# Patient Record
Sex: Male | Born: 1995 | Hispanic: No
Health system: Southern US, Community
[De-identification: ages and names within clinical notes are randomized; demographics above are authoritative.]

---

## 2019-06-16 ENCOUNTER — Emergency Department (HOSPITAL_COMMUNITY)
Admission: EM | Admit: 2019-06-16 | Discharge: 2019-06-17 | Disposition: A | Discharge status: Home / Self Care | Payer: BC Managed Care – PPO | Attending: Emergency Medicine | Admitting: Emergency Medicine

## 2019-06-16 ENCOUNTER — Emergency Department (HOSPITAL_COMMUNITY): Payer: BC Managed Care – PPO

## 2019-06-16 ENCOUNTER — Encounter (HOSPITAL_COMMUNITY): Payer: Self-pay

## 2019-06-16 ENCOUNTER — Other Ambulatory Visit: Payer: Self-pay

## 2019-06-16 DIAGNOSIS — Y929 Unspecified place or not applicable: Secondary | ICD-10-CM | POA: Diagnosis not present

## 2019-06-16 DIAGNOSIS — S8991XA Unspecified injury of right lower leg, initial encounter: Secondary | ICD-10-CM | POA: Insufficient documentation

## 2019-06-16 DIAGNOSIS — W010XXA Fall on same level from slipping, tripping and stumbling without subsequent striking against object, initial encounter: Secondary | ICD-10-CM | POA: Insufficient documentation

## 2019-06-16 DIAGNOSIS — Y9367 Activity, basketball: Secondary | ICD-10-CM | POA: Insufficient documentation

## 2019-06-16 DIAGNOSIS — Y999 Unspecified external cause status: Secondary | ICD-10-CM | POA: Insufficient documentation

## 2019-06-16 DIAGNOSIS — W19XXXA Unspecified fall, initial encounter: Secondary | ICD-10-CM

## 2019-06-16 MED ORDER — IBUPROFEN 800 MG PO TABS
800.0000 mg | ORAL_TABLET | Freq: Once | ORAL | Status: AC
  Administered 2019-06-16: 800 mg via ORAL
  Filled 2019-06-16: qty 1

## 2019-06-16 MED ORDER — ACETAMINOPHEN 500 MG PO TABS
1000.0000 mg | ORAL_TABLET | Freq: Once | ORAL | Status: AC
  Administered 2019-06-16: 1000 mg via ORAL
  Filled 2019-06-16: qty 2

## 2019-06-16 NOTE — ED Notes (Signed)
Pt to xray

## 2019-06-16 NOTE — ED Triage Notes (Signed)
Pt states that while playing basketball tonight around 1930 he fell and hurt his right knee. Pt unable to walk using the right leg.

## 2019-06-16 NOTE — ED Provider Notes (Signed)
[facility] DEPT Provider Note   CSN: [id number] Arrival date & time: 06/16/19  2132    History   Chief Complaint Chief Complaint  Patient presents with  . Knee Pain    HPI Jimmy Hunt is a 23 y.o. male with a history of obesity who presents to the emergency department with a chief complaint of fall.  The patient reports that he was playing basketball when he tripped and fell.  He reports that he fell to the right, and his right knee splayed out to the left side of his body.  He denies hitting his head, syncope, nausea, or vomiting.  He reports that he was able to stand after the fall, ambulate back to his car, and drive his car back to [place] from [facility]  He reports the pain was mild after the fall and continued to worsen.  He reports that by the time he arrived in [place] that he was unable to bear weight without significant pain on his right leg.  He denies numbness, weakness, redness, swelling, fever, chills, right ankle or hip pain.  No previous right knee injury.  No treatment prior to arrival.       The history is provided by the patient. No language interpreter was used.    History reviewed. No pertinent past medical history.  There are no active problems to display for this patient.   History reviewed. No pertinent surgical history.      Home Medications    Prior to Admission medications   Medication Sig Start Date End Date Taking? Authorizing Provider  methocarbamol (ROBAXIN) 500 MG tablet Take 1 tablet (500 mg total) by mouth 2 (two) times daily. 06/17/19   Kazumi Lachney A, PA-C    Family History No family history on file.  Social History Social History   Tobacco Use  . Smoking status: Not on file  Substance Use Topics  . Alcohol use: Not on file  . Drug use: Not on file     Allergies   Patient has no known allergies.   Review of Systems Review of Systems  Constitutional: Negative for  activity change.  Respiratory: Negative for shortness of breath.   Cardiovascular: Negative for chest pain.  Gastrointestinal: Negative for abdominal pain, diarrhea and vomiting.  Musculoskeletal: Positive for arthralgias, gait problem, joint swelling and myalgias. Negative for back pain, neck pain and neck stiffness.  Skin: Negative for rash.  Neurological: Negative for dizziness, syncope, weakness and numbness.     Physical Exam Updated Vital Signs BP (!) 166/91   Pulse 93   Temp 99.2 F (37.3 C) (Oral)   Resp 18   Ht 5\' 10"  (1.778 m)   Wt (!) 149.7 kg   SpO2 95%   BMI 47.35 kg/m   Physical Exam Vitals signs and nursing note reviewed.  Constitutional:      Appearance: He is well-developed.  HENT:     Head: Normocephalic.  Eyes:     Conjunctiva/sclera: Conjunctivae normal.  Neck:     Musculoskeletal: Neck supple.  Cardiovascular:     Rate and Rhythm: Normal rate and regular rhythm.     Heart sounds: No murmur.  Pulmonary:     Effort: Pulmonary effort is normal.  Abdominal:     General: There is no distension.     Palpations: Abdomen is soft.  Musculoskeletal:        General: Tenderness present. No deformity or signs of injury.     Right  lower leg: No edema.     Left lower leg: No edema.     Comments: Tender to palpation diffusely to the medial and lateral joint line of the right knee.  No focal tenderness over the quadriceps or patellar tendon.  Full active and passive range of motion of the right knee as well as the right hip and ankle.  Independently moves all digits of the right foot.  DP pulses are 2+ and symmetric.  Sensation is intact and equal throughout.  5-5 strength against resistance with dorsiflexion plantarflexion.  No obvious deformities or masses to the right knee.  Negative anterior drawer test.  Negative posterior drawer test.  Increased pain with valgus stress test.  Normal varus stress test.  Skin:    General: Skin is warm and dry.  Neurological:      Mental Status: He is alert.  Psychiatric:        Behavior: Behavior normal.      ED Treatments / Results  Labs (all labs ordered are listed, but only abnormal results are displayed) Labs Reviewed - No data to display  EKG None  Radiology Dg Knee Complete 4 Views Right  Result Date: 06/16/2019 CLINICAL DATA:  Right knee injury during basketball. Unable to bear weight. EXAM: RIGHT KNEE - COMPLETE 4+ VIEW COMPARISON:  None. FINDINGS: No evidence of fracture, dislocation, or joint effusion. No evidence of arthropathy or other focal bone abnormality. Thickening and irregularity of the patellar tendon with stranding in Hoffa's fat pad. No frank discontinuity. IMPRESSION: Thickening and irregularity of the patellar tendon may suggest soft tissue injury. No acute fracture or traumatic malalignment. Electronically Signed   By: Kreg ShropshirePrice  DeHay M.D.   On: 06/16/2019 22:16    Procedures Procedures (including critical care time)  Medications Ordered in ED Medications  ibuprofen (ADVIL) tablet 800 mg (800 mg Oral Given 06/16/19 2338)  acetaminophen (TYLENOL) tablet 1,000 mg (1,000 mg Oral Given 06/16/19 2338)     Initial Impression / Assessment and Plan / ED Course  I have reviewed the triage vital signs and the nursing notes.  Pertinent labs & imaging results that were available during my care of the patient were reviewed by me and considered in my medical decision making (see chart for details).        23 year old male presenting with right knee pain after a fall while playing basketball.  He was able to stand, walk, and [place] [facility] [place] [facility] [place] prior to arrival.  He is hemodynamically stable in the ER.  He was given Tylenol and ibuprofen for pain control.  He is neurovascularly intact on exam.  X-ray of the right knee with thickening and irregularity of the patellar tendon that may suggest soft tissue injury.  On exam, patient has no focal tenderness over  the patellar tendon.  He has full active and passive range of motion of the knee.  Pain is increased with valgus stress test, but his anterior and posterior drawer test are negative as well as his varus stress test.  Low suspicion for patellar tendon rupture, right knee fracture or dislocation.  He was placed in a knee sleeve and was given crutches.  He has been given a referral to orthopedics for follow-up.  Recommended RICE therapy.  All questions answered.  Blood pressure was elevated towards the end of his visits, but he is having no symptoms associated with hypertensive urgency or emergency.  Recommended recheck of his blood pressure in 1 week.  He is otherwise hemodynamically stable and in no acute distress.  Safe for discharge home with outpatient follow-up.  Final Clinical Impressions(s) / ED Diagnoses   Final diagnoses:  Fall, initial encounter  Injury of right knee, initial encounter    ED Discharge Orders         Ordered    methocarbamol (ROBAXIN) 500 MG tablet  2 times daily     06/17/19 0018           Emmajean Ratledge, Pedro EarlsMia A, PA-C 06/17/19 0403    Palumbo, April, MD 06/17/19 [id number]

## 2019-06-17 MED ORDER — METHOCARBAMOL 500 MG PO TABS: 500.0000 mg | ORAL_TABLET | Freq: Two times a day (BID) | ORAL | 0 refills | Status: AC

## 2019-06-17 NOTE — Discharge Instructions (Signed)
Thank you for allowing me to care for you today in the Emergency Department.   Wear the brace on your right knee unless you are showering.  You can take 600 mg of ibuprofen with food or 650 mg of Tylenol once every 6 hours for pain control.  You can also alternate between these medications every 3 hours.  For instance, you can take Tylenol at noon, followed by a dose of ibuprofen at 3, followed by a dose of Tylenol at 6.  You can take 1 tablet of Robaxin up to 2 times daily.  This medication may make you drowsy.  Do not work or drive until you know how it impacts you.  Elevate your right leg so that your toes are at or above the level of your nose.  Apply an ice pack for 15 to 20 minutes as frequently as needed to help with pain and swelling.  Use the crutches until you can bear weight without significant pain on your right foot.  If you remain unable to bear weight or if your pain does not start to improve with this regimen in the next 4 to 5 days, please follow-up with orthopedics.  Their number is listed above.  Return to the emergency department if you have any fall or injury, if your toes turn blue, if you develop new weakness or numbness, have another fall, or develop other new, concerning symptoms.

## 2019-07-14 ENCOUNTER — Other Ambulatory Visit: Payer: Self-pay

## 2019-07-14 DIAGNOSIS — Z20822 Contact with and (suspected) exposure to covid-19: Secondary | ICD-10-CM

## 2019-07-15 LAB — NOVEL CORONAVIRUS, NAA: SARS-CoV-2, NAA: NOT DETECTED

## 2020-09-29 ENCOUNTER — Other Ambulatory Visit: Payer: Self-pay

## 2020-09-29 DIAGNOSIS — Z20822 Contact with and (suspected) exposure to covid-19: Secondary | ICD-10-CM

## 2020-09-30 LAB — NOVEL CORONAVIRUS, NAA: SARS-CoV-2, NAA: NOT DETECTED

## 2020-09-30 LAB — SARS-COV-2, NAA 2 DAY TAT

## 2021-06-07 IMAGING — CR RIGHT KNEE - COMPLETE 4+ VIEW
4 series · 4 of 4 positions shown · non-contrast
Comparison: None.

CLINICAL DATA: Right knee injury during basketball. Unable to bear
weight.

EXAM:
RIGHT KNEE - COMPLETE 4+ VIEW

[x knee ap right (1 of 3)]
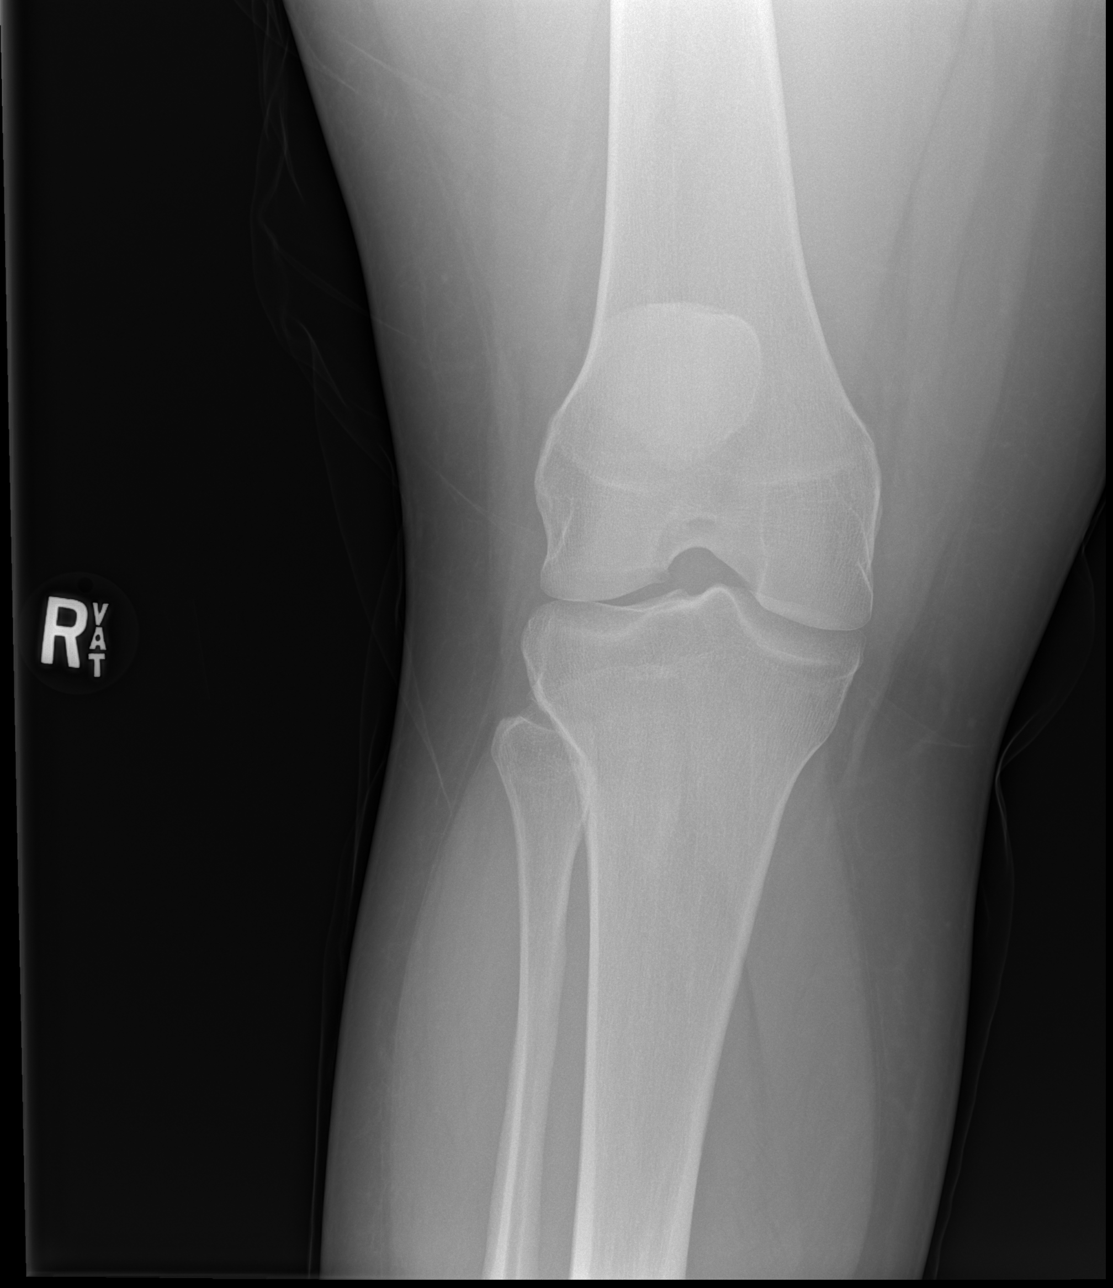

[x knee ap right (2 of 3)]
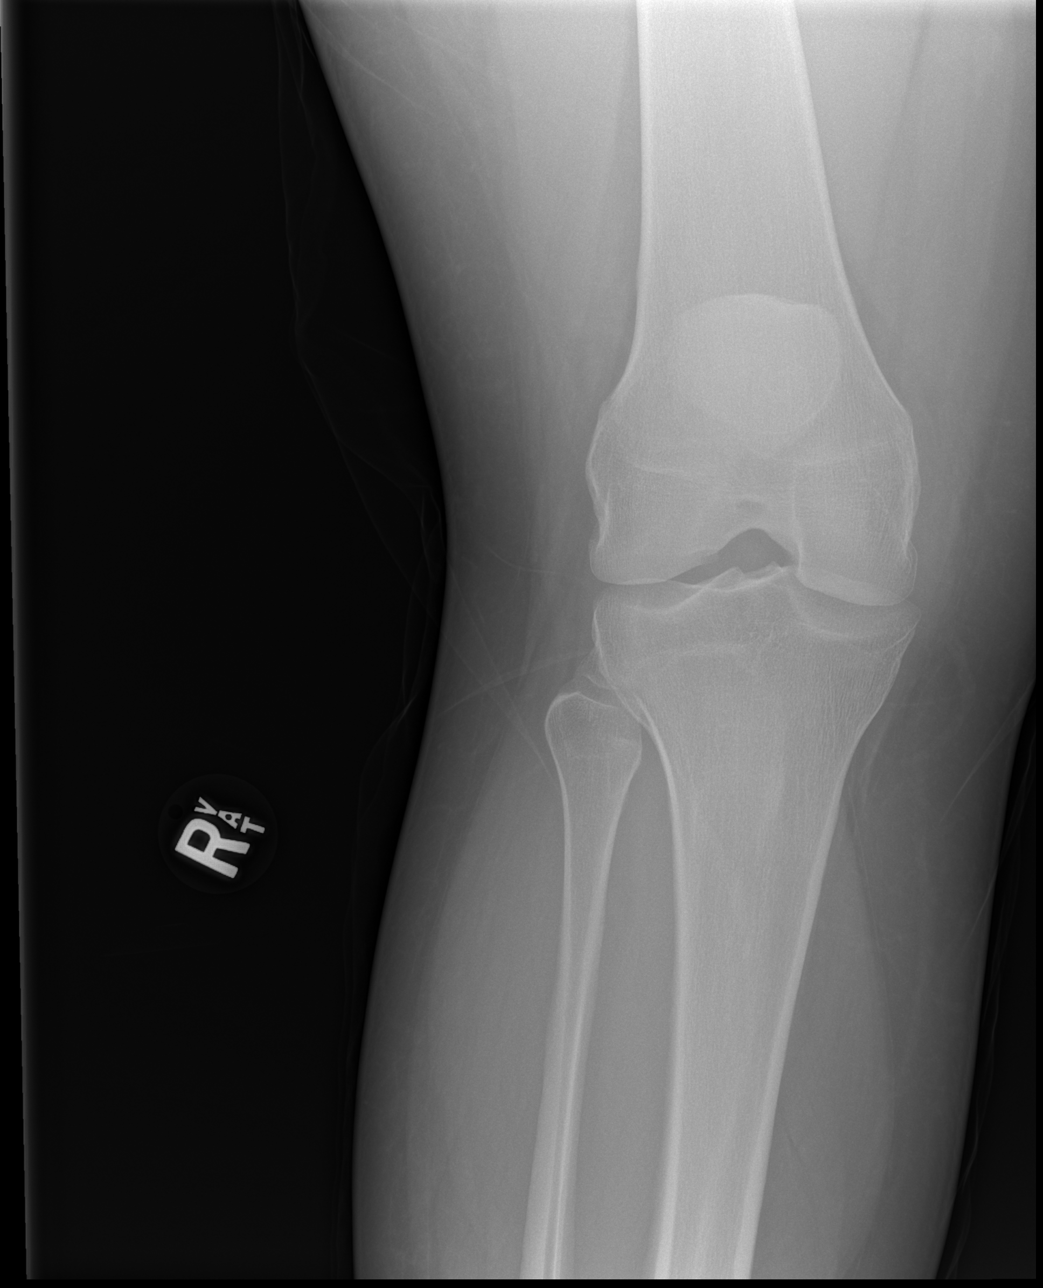

[x knee ap right (3 of 3)]
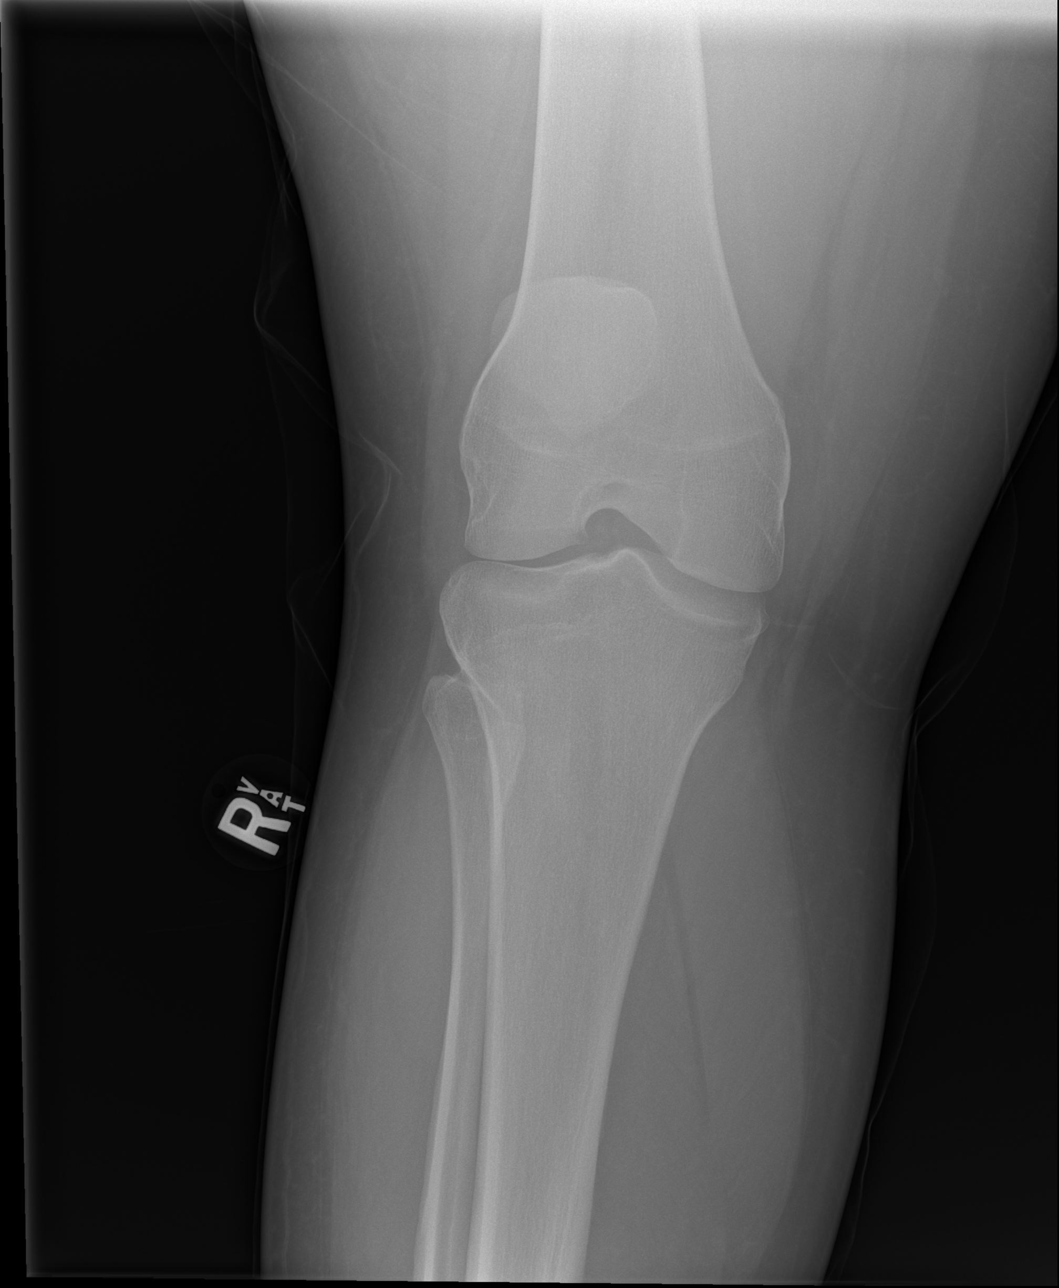

[x knee lat right]
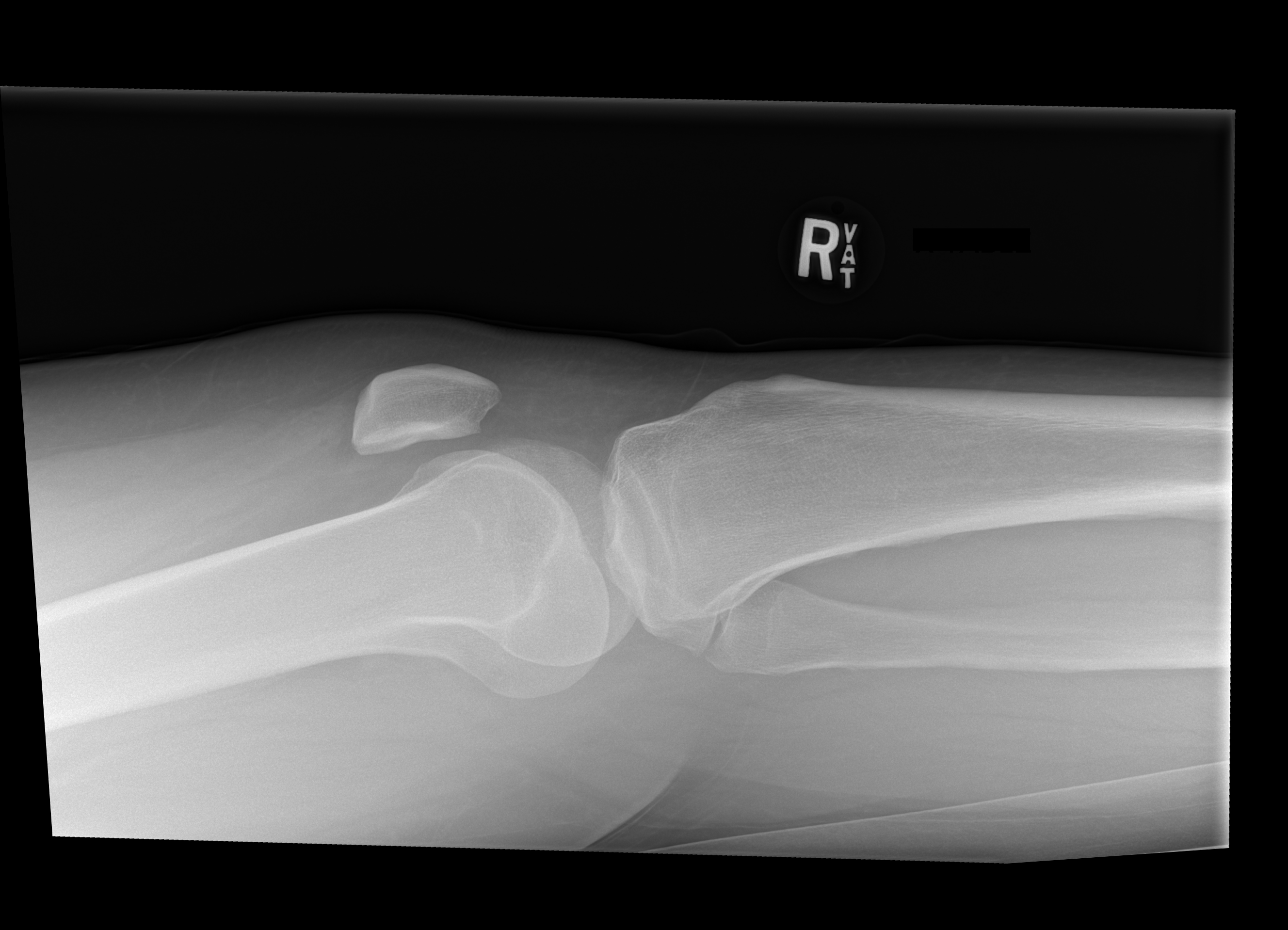

[4 of 4 positions shown; findings below may reference images not displayed]

FINDINGS: No evidence of fracture, dislocation, or joint effusion. No evidence
of arthropathy or other focal bone abnormality. Thickening and
irregularity of the patellar tendon with stranding in Hoffa's fat
pad. No frank discontinuity.
IMPRESSION: Thickening and irregularity of the patellar tendon may suggest soft
tissue injury.

No acute fracture or traumatic malalignment.
